# Patient Record
Sex: Male | Born: 1990 | Race: Black or African American | Hispanic: No | Marital: Single | Smoking: Never smoker
Health system: Southern US, Community
[De-identification: ages and names within clinical notes are randomized; demographics above are authoritative.]

---

## 2018-09-18 DIAGNOSIS — Z206 Contact with and (suspected) exposure to human immunodeficiency virus [HIV]: Secondary | ICD-10-CM | POA: Diagnosis not present

## 2018-09-18 DIAGNOSIS — Z3009 Encounter for other general counseling and advice on contraception: Secondary | ICD-10-CM | POA: Diagnosis not present

## 2018-09-18 DIAGNOSIS — Z23 Encounter for immunization: Secondary | ICD-10-CM | POA: Diagnosis not present

## 2018-09-18 DIAGNOSIS — Z111 Encounter for screening for respiratory tuberculosis: Secondary | ICD-10-CM | POA: Diagnosis not present

## 2018-09-18 DIAGNOSIS — Z0000 Encounter for general adult medical examination without abnormal findings: Secondary | ICD-10-CM | POA: Diagnosis not present

## 2018-09-18 DIAGNOSIS — Z1388 Encounter for screening for disorder due to exposure to contaminants: Secondary | ICD-10-CM | POA: Diagnosis not present

## 2018-09-18 DIAGNOSIS — Z049 Encounter for examination and observation for unspecified reason: Secondary | ICD-10-CM | POA: Diagnosis not present

## 2018-09-18 DIAGNOSIS — Z0389 Encounter for observation for other suspected diseases and conditions ruled out: Secondary | ICD-10-CM | POA: Diagnosis not present

## 2018-10-09 DIAGNOSIS — R7612 Nonspecific reaction to cell mediated immunity measurement of gamma interferon antigen response without active tuberculosis: Secondary | ICD-10-CM | POA: Diagnosis not present

## 2018-10-12 ENCOUNTER — Ambulatory Visit
Admission: RE | Admit: 2018-10-12 | Discharge: 2018-10-12 | Disposition: A | Discharge status: Home / Self Care | Payer: Medicaid Other | Source: Ambulatory Visit | Attending: Internal Medicine | Admitting: Internal Medicine

## 2018-10-12 ENCOUNTER — Other Ambulatory Visit: Payer: Self-pay | Admitting: Internal Medicine

## 2018-10-12 DIAGNOSIS — R7611 Nonspecific reaction to tuberculin skin test without active tuberculosis: Secondary | ICD-10-CM | POA: Diagnosis not present

## 2018-10-12 DIAGNOSIS — R7612 Nonspecific reaction to cell mediated immunity measurement of gamma interferon antigen response without active tuberculosis: Secondary | ICD-10-CM

## 2018-10-13 ENCOUNTER — Ambulatory Visit (INDEPENDENT_AMBULATORY_CARE_PROVIDER_SITE_OTHER): Payer: Medicaid Other | Admitting: Family Medicine

## 2018-10-13 ENCOUNTER — Encounter: Payer: Self-pay | Admitting: Family Medicine

## 2018-10-13 VITALS — BP 146/92 | HR 89 | Temp 98.5°F | Resp 17 | Ht 63.0 in | Wt 134.4 lb

## 2018-10-13 DIAGNOSIS — R030 Elevated blood-pressure reading, without diagnosis of hypertension: Secondary | ICD-10-CM | POA: Diagnosis not present

## 2018-10-13 DIAGNOSIS — Z7689 Persons encountering health services in other specified circumstances: Secondary | ICD-10-CM

## 2018-10-13 DIAGNOSIS — Z23 Encounter for immunization: Secondary | ICD-10-CM

## 2018-10-13 NOTE — Progress Notes (Signed)
Taytum Wheller, is a 27 y.o. male  HFW:[id number]  OYD:[id number]  DOB - Aug 24, 1991  CC:  Chief Complaint  Patient presents with  . Establish Care       HPI: Domnique is a 27 y.o. male is here today to establish care. No significant medical history. Recently see at health department due to recent immigration and referred here for primary care. 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 Reports as a child he receive BCG vaccine which is given in [place] countries to reduce acquiring TB.He denies cough, shortness of breath, night sweats, unintentional weight loss, or fatigue. Patient denies new headaches, chest pain, abdominal pain, nausea, new weakness , numbness or tingling, SOB, edema, or worrisome cough. Blood pressure elevated on arrival today at 151/91. Reports previously elevated at health department, however was "normal" with recheck. No prior history if renal disease or heart disease. Denies chest pain or palpitations. Never prescribed any chronic medications.  Current medications:No current outpatient medications on file.   Pertinent family medical history: Limited knowledge of family history given patient is refugee from [place]  No known allergies  Social History   Socioeconomic History  . Marital status: Single    Spouse name: Not on file  . Number of children: Not on file  . Years of education: Not on file  . Highest education level: Not on file  Occupational History  . Not on file  Social Needs  . Financial resource strain: Not on file  . Food insecurity:    Worry: Not on file    Inability: Not on file  . Transportation needs:    Medical: Not on file    Non-medical: Not on file  Tobacco Use  . Smoking status: Never Smoker  . Smokeless tobacco: Never Used  Substance and Sexual Activity  . Alcohol use: Never    Frequency: Never  . Drug use: Never  . Sexual activity: Not on file  Lifestyle  . Physical activity:    Days per week:  Not on file    Minutes per session: Not on file  . Stress: Not on file  Relationships  . Social connections:    Talks on phone: Not on file    Gets together: Not on file    Attends religious service: Not on file    Active member of club or organization: Not on file    Attends meetings of clubs or organizations: Not on file    Relationship status: Not on file  . Intimate partner violence:    Fear of current or ex partner: Not on file    Emotionally abused: Not on file    Physically abused: Not on file    Forced sexual activity: Not on file  Other Topics Concern  . Not on file  Social History Narrative  . Not on file    Review of Systems: Constitutional: Negative for fever, chills, diaphoresis, activity change, appetite change and fatigue. HENT: Negative for ear pain, nosebleeds, congestion, facial swelling, rhinorrhea, neck pain, neck stiffness and ear discharge.  Eyes: Negative for pain, discharge, redness, itching and visual disturbance. Respiratory: Negative for cough, choking, chest tightness, shortness of breath, wheezing and stridor.  Cardiovascular: Negative for chest pain, palpitations and leg swelling. Gastrointestinal: Negative for abdominal distention. Genitourinary: Negative for dysuria, urgency, frequency, hematuria, flank pain, decreased urine volume, difficulty urinating. Musculoskeletal: Negative for back pain, joint swelling, arthralgia and gait problem. Neurological: Negative for dizziness, tremors, seizures, syncope, facial asymmetry, speech difficulty,  weakness, light-headedness, numbness and headaches.  Hematological: Negative for adenopathy. Does not bruise/bleed easily. Psychiatric/Behavioral: Negative for hallucinations, behavioral problems, confusion, dysphoric mood, decreased concentration and agitation.    Objective:   Vitals:   10/13/18 0934 10/13/18 1036  BP: (!) 151/91 (!) 146/92  Pulse: 89   Resp: 17   Temp: 98.5 F (36.9 C)   SpO2: 96%      BP Readings from Last 3 Encounters:  10/13/18 (!) 151/91    Filed Weights   10/13/18 0934  Weight: 134 lb 6.4 oz (61 kg)      Physical Exam: Constitutional: Patient appears well-developed and well-nourished. No distress. HENT: Normocephalic, atraumatic, External right and left ear normal. Oropharynx is clear and moist.  Eyes: Conjunctivae and EOM are normal. PERRLA, no scleral icterus. Neck: Normal ROM. Neck supple. No JVD. No tracheal deviation. No thyromegaly. CVS: RRR, S1/S2 +, no murmurs, no gallops, no carotid bruit.  Pulmonary: Effort and breath sounds normal, no stridor, rhonchi, wheezes, rales.  Abdominal: Soft. BS +, no distension, tenderness, rebound or guarding.  Musculoskeletal: Normal range of motion. No edema and no tenderness.  Neuro: Alert. Normal muscle tone coordination. Normal gait. BUE and BLE strength 5/5. Bilateral hand grips symmetrical.  Skin: Skin is warm and dry. No rash noted. Not diaphoretic. No erythema. No pallor. Psychiatric: Normal mood and affect. Behavior, judgment, thought content normal.    Assessment and plan:  1. Encounter to establish care 2. Need for MMR vaccine Immunization record reviewed from [place]  -MMR Given, series completed. 3. Elevated BP without diagnosis of hypertension, new problem -Return for repeat in one month    Orders Placed This Encounter  Procedures  . MMR vaccine subcutaneous     Return in about 1 month (around 11/13/2018) for Hep B and Hep A immunizations. Blood pressure check.   The patient was given clear instructions to go to ER or return to medical center if symptoms don't improve, worsen or new problems develop. The patient verbalized understanding. The patient was advised  to call and obtain lab results if they haven't heard anything from out office within 7-10 business days.  Molli Barrows, FNP Primary Care at [facility] [address], [place] [address] [phone]:  [phone]   This note has been created with Dragon speech recognition software and [employer], materials Any transcriptional errors are unintentional.

## 2018-10-13 NOTE — Patient Instructions (Addendum)
[facility]    Edward Tyler was seen by Joaquin CourtsKimberly Harris, FNP today.   Jaxxon Treece's primary care provider is Norins, Rosalyn GessMichael E, MD.   For the best care possible, you should try to see Joaquin CourtsKimberly Harris, FNP-C whenever you come to the clinic.   We look forward to seeing you again soon!  If you have any questions about your visit today, please call us at [phone] or feel free to reach your primary care provider via MyChart.      Hypertension Hypertension is another name for high blood pressure. High blood pressure forces your heart to work harder to pump blood. This can cause problems over time. There are two numbers in a blood pressure reading. There is a top number (systolic) over a bottom number (diastolic). It is best to have a blood pressure below 120/80. Healthy choices can help lower your blood pressure. You may need medicine to help lower your blood pressure if:  Your blood pressure cannot be lowered with healthy choices.  Your blood pressure is higher than 130/80. Follow these instructions at home: Eating and drinking   If directed, follow the DASH eating plan. This diet includes: ? Filling half of your plate at each meal with fruits and vegetables. ? Filling one quarter of your plate at each meal with whole grains. Whole grains include whole wheat pasta, brown rice, and whole grain bread. ? Eating or drinking low-fat dairy products, such as skim milk or low-fat yogurt. ? Filling one quarter of your plate at each meal with low-fat (lean) proteins. Low-fat proteins include fish, skinless chicken, eggs, beans, and tofu. ? Avoiding fatty meat, cured and processed meat, or chicken with skin. ? Avoiding premade or processed food.  Eat less than 1,500 mg of salt (sodium) a day.  Limit alcohol use to no more than 1 drink a day for nonpregnant women and 2 drinks a day for men. One drink equals 12 oz of  beer, 5 oz of wine, or 1 oz of hard liquor. Lifestyle  Work with your doctor to stay at a healthy weight or to lose weight. Ask your doctor what the best weight is for you.  Get at least 30 minutes of exercise that causes your heart to beat faster (aerobic exercise) most days of the week. This may include walking, swimming, or biking.  Get at least 30 minutes of exercise that strengthens your muscles (resistance exercise) at least 3 days a week. This may include lifting weights or pilates.  Do not use any products that contain nicotine or tobacco. This includes cigarettes and e-cigarettes. If you need help quitting, ask your doctor.  Check your blood pressure at home as told by your doctor.  Keep all follow-up visits as told by your doctor. This is important. Medicines  Take over-the-counter and prescription medicines only as told by your doctor. Follow directions carefully.  Do not skip doses of blood pressure medicine. The medicine does not work as well if you skip doses. Skipping doses also puts you at risk for problems.  Ask your doctor about side effects or reactions to medicines that you should watch for. Contact a doctor if:  You think you are having a reaction to the medicine you are taking.  You have headaches that keep coming back (recurring).  You feel dizzy.  You have swelling in your ankles.  You have trouble with your vision. Get help right away  if:  You get a very bad headache.  You start to feel confused.  You feel weak or numb.  You feel faint.  You get very bad pain in your: ? Chest. ? Belly (abdomen).  You throw up (vomit) more than once.  You have trouble breathing. Summary  Hypertension is another name for high blood pressure.  Making healthy choices can help lower blood pressure. If your blood pressure cannot be controlled with healthy choices, you may need to take medicine. This information is not intended to replace advice given to you by  your health care provider. Make sure you discuss any questions you have with your health care provider. Document Released: 03/22/2008 Document Revised: 09/01/2016 Document Reviewed: 09/01/2016 Elsevier Interactive Patient Education  2019 [employer]

## 2018-11-13 ENCOUNTER — Ambulatory Visit (INDEPENDENT_AMBULATORY_CARE_PROVIDER_SITE_OTHER): Payer: Medicaid Other

## 2018-11-13 VITALS — BP 155/94 | HR 99

## 2018-11-13 DIAGNOSIS — R030 Elevated blood-pressure reading, without diagnosis of hypertension: Secondary | ICD-10-CM

## 2018-11-13 DIAGNOSIS — Z23 Encounter for immunization: Secondary | ICD-10-CM

## 2018-11-13 NOTE — Progress Notes (Signed)
Patient here for BP check & to update vaccines(receive 3rd Hep B & 2nd Hep A). BP on initial check was 151/89. Let patient sit for 15 minutes & rechecked BP. BP was 155/94. Spoke with provider & she states to have him come in to the office to initiate BP medication. Appointment made for 11/21/2018 @ 4:10.  Otis Peak, CMA.

## 2018-11-21 ENCOUNTER — Ambulatory Visit: Payer: Medicaid Other | Admitting: Family Medicine

## 2019-04-30 ENCOUNTER — Telehealth: Payer: Self-pay

## 2019-04-30 NOTE — Telephone Encounter (Signed)
Called patient to do their pre-visit COVID screening using [employer] [phone]).  Call went to voicemail. Unable to do prescreening.

## 2019-05-01 ENCOUNTER — Ambulatory Visit: Payer: Medicaid Other | Admitting: Family Medicine

## 2019-07-23 IMAGING — CR DG CHEST 1V
1 series · 1 of 1 positions shown · non-contrast
Comparison: None.

CLINICAL DATA: Positive TB test.

EXAM:
CHEST  1 VIEW

[w chest pa]
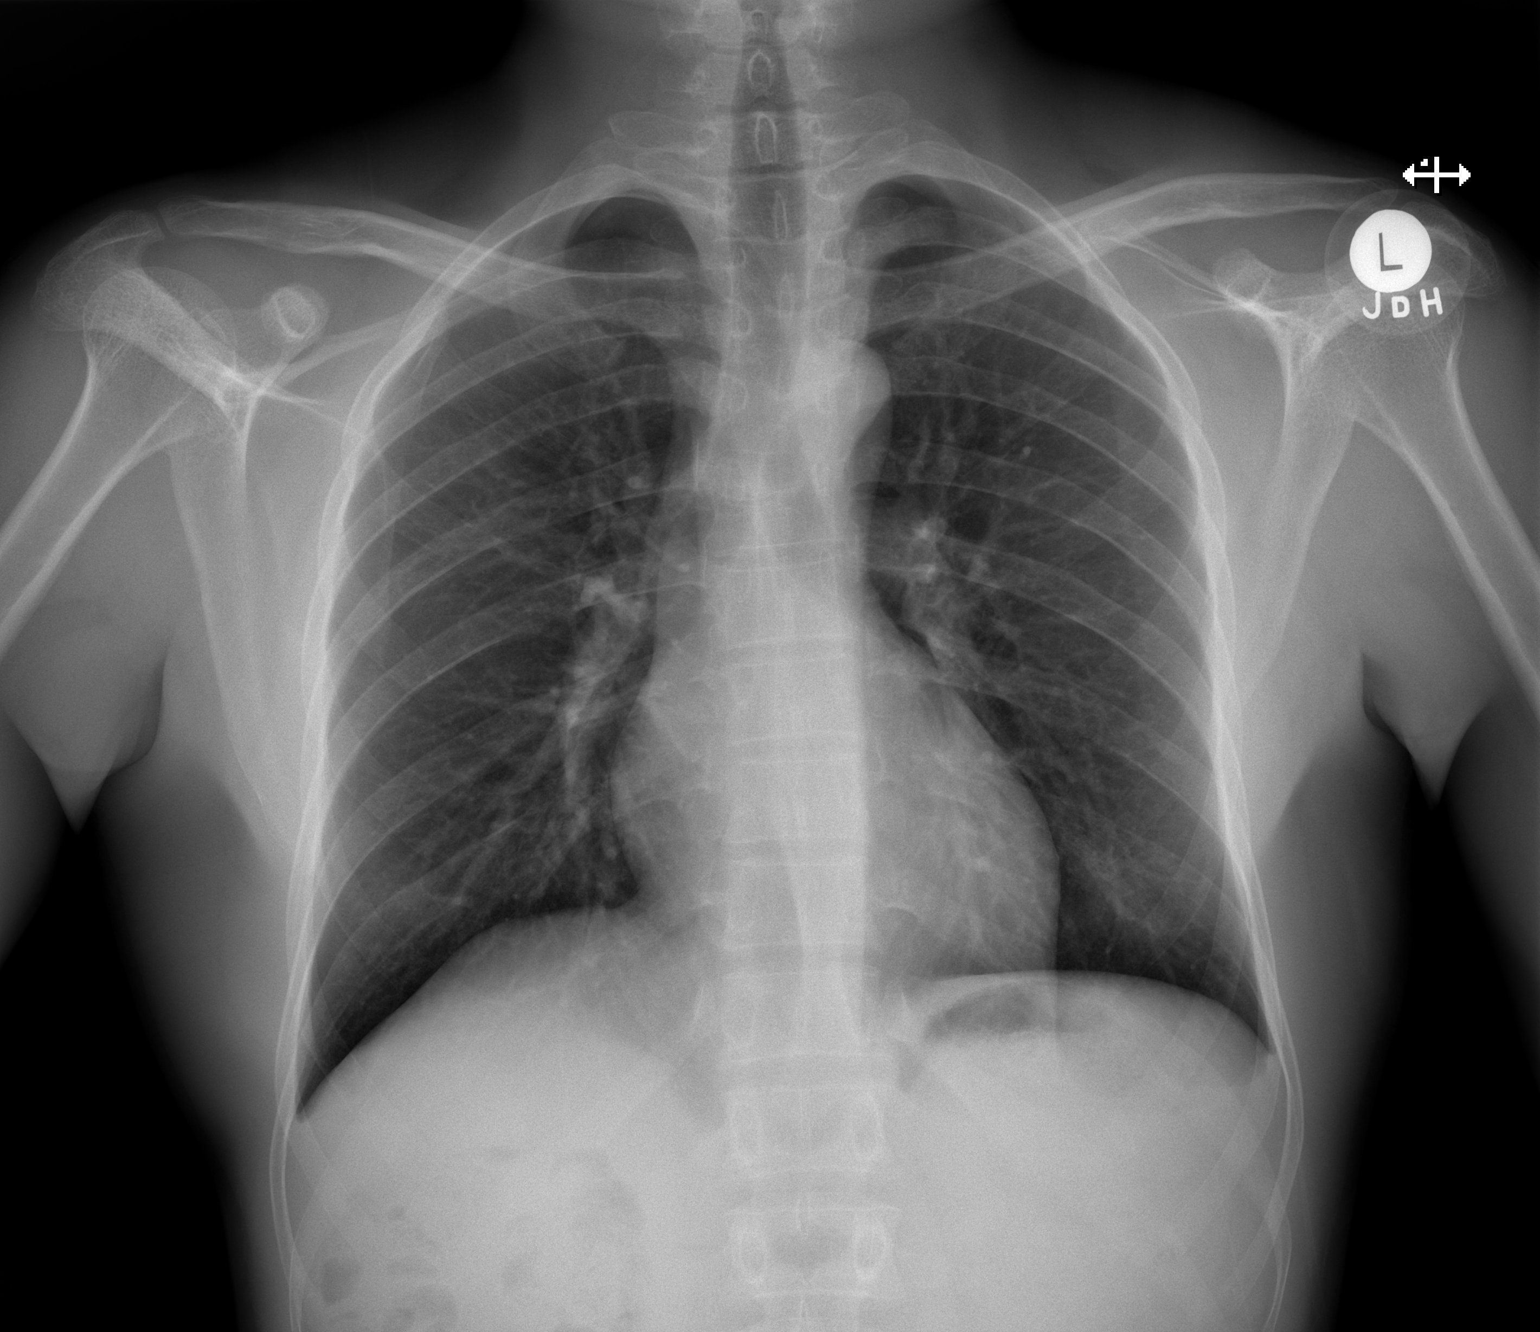

[1 of 1 positions shown; findings below may reference images not displayed]

FINDINGS: The heart size and mediastinal contours are within normal limits.
Both lungs are clear. The visualized skeletal structures are
unremarkable.
IMPRESSION: No active disease.
